# Patient Record
Sex: Male | Born: 1950 | Race: Black or African American | Hispanic: No | Marital: Single
Health system: Southern US, Community
[De-identification: ages and names within clinical notes are randomized; demographics above are authoritative.]

---

## 2007-08-16 ENCOUNTER — Emergency Department (HOSPITAL_COMMUNITY): Admission: EM | Admit: 2007-08-16 | Discharge: 2007-08-16 | Payer: Self-pay | Admitting: Emergency Medicine

## 2008-10-17 IMAGING — CR DG KNEE COMPLETE 4+V*R*
4 series · 4 of 4 positions shown · non-contrast
Comparison: none

CLINICAL DATA: Chronic knee pain, no injury

RIGHT KNEE - 4 VIEW

[view not recorded (1 of 4)]
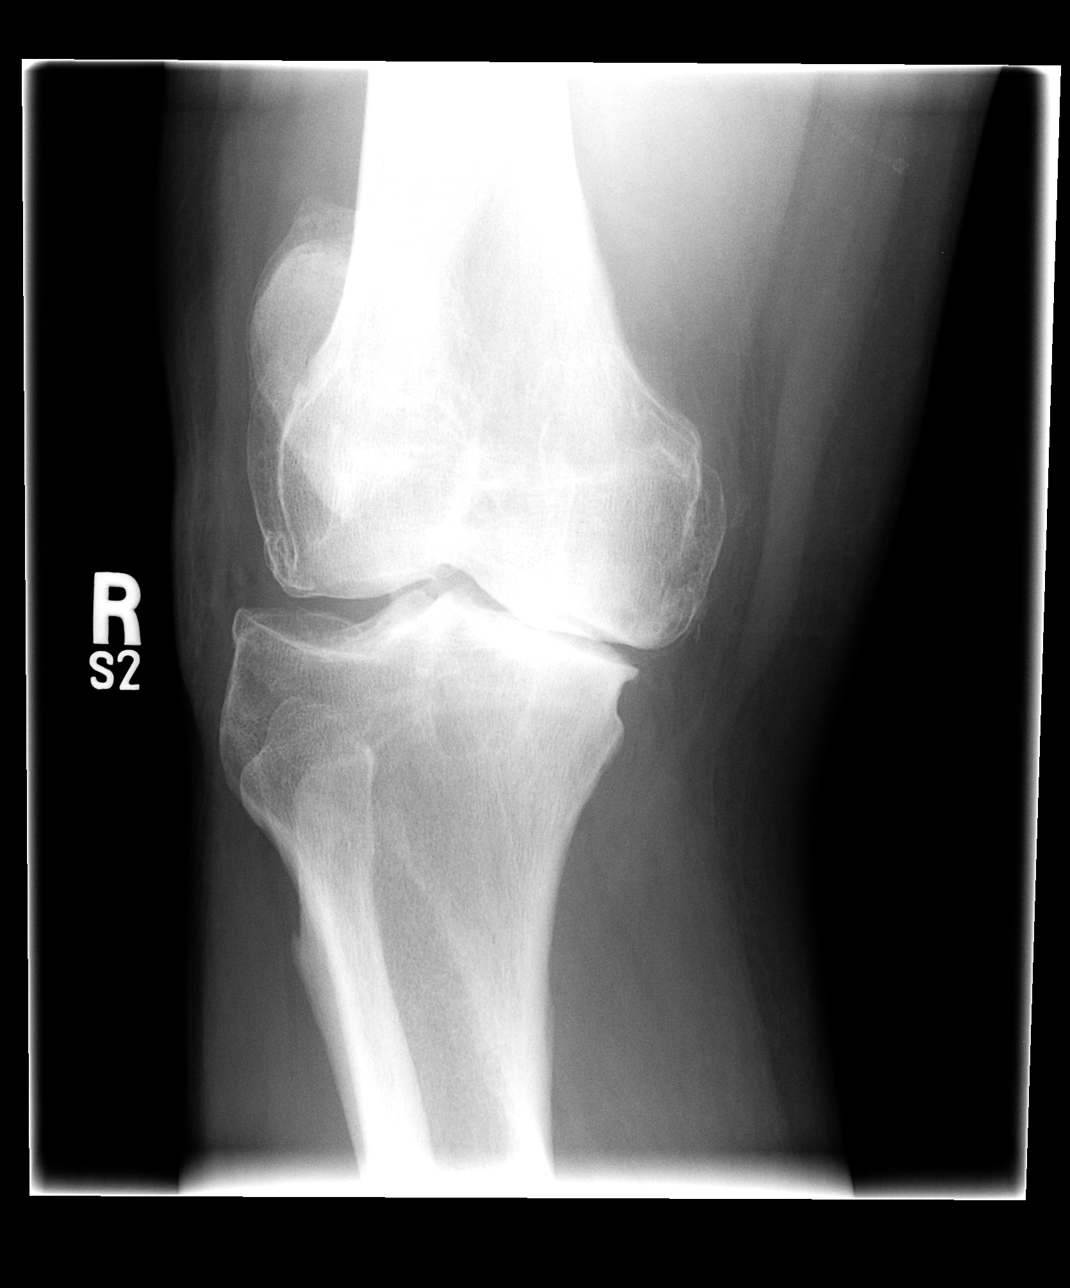

[view not recorded (2 of 4)]
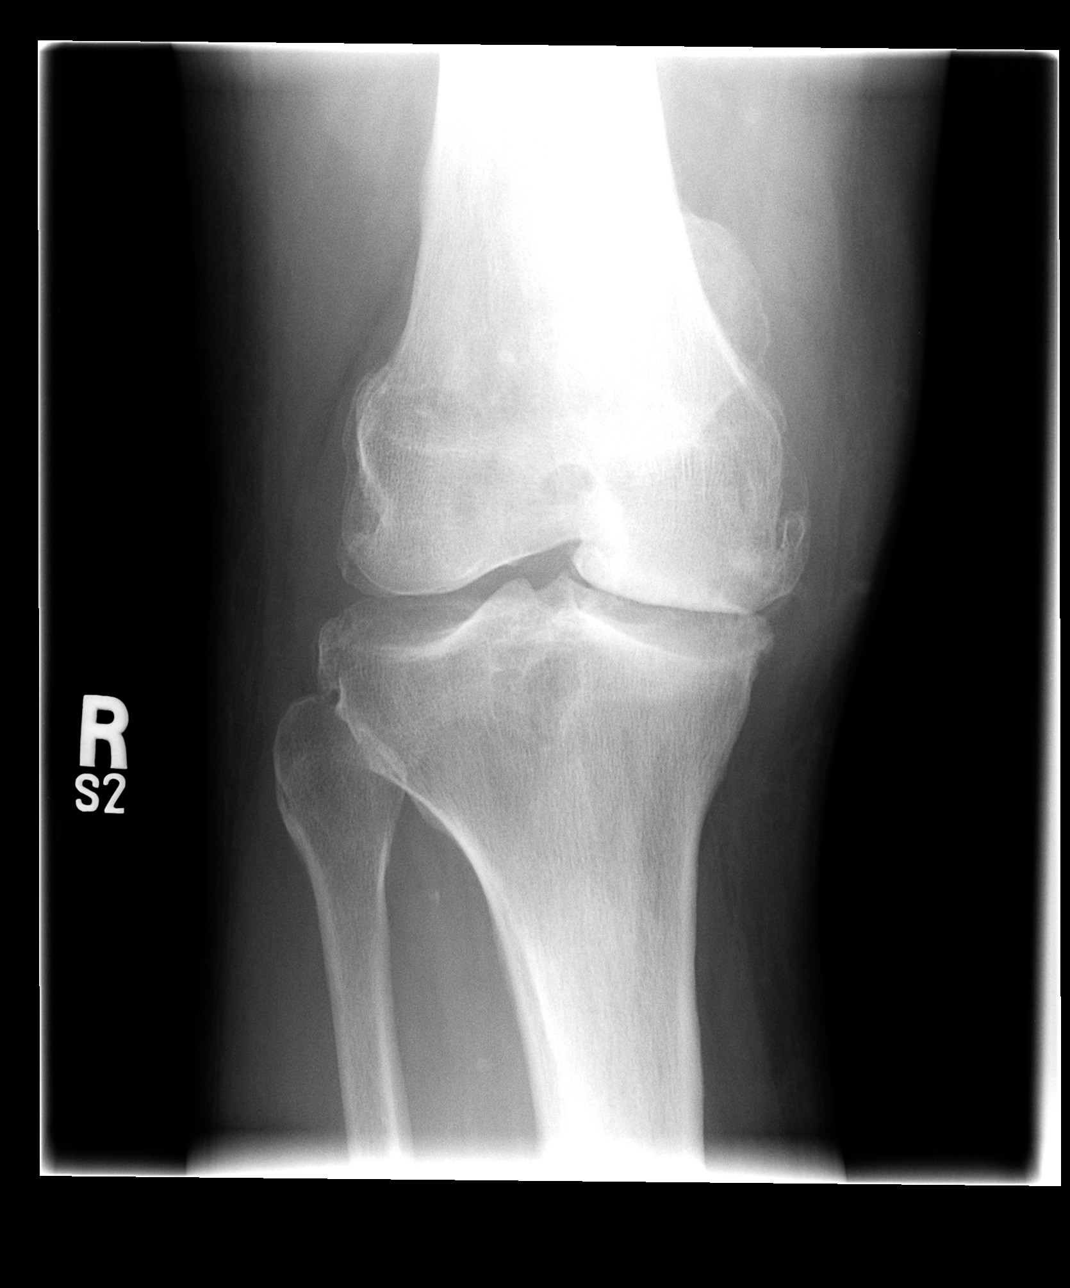

[view not recorded (3 of 4)]
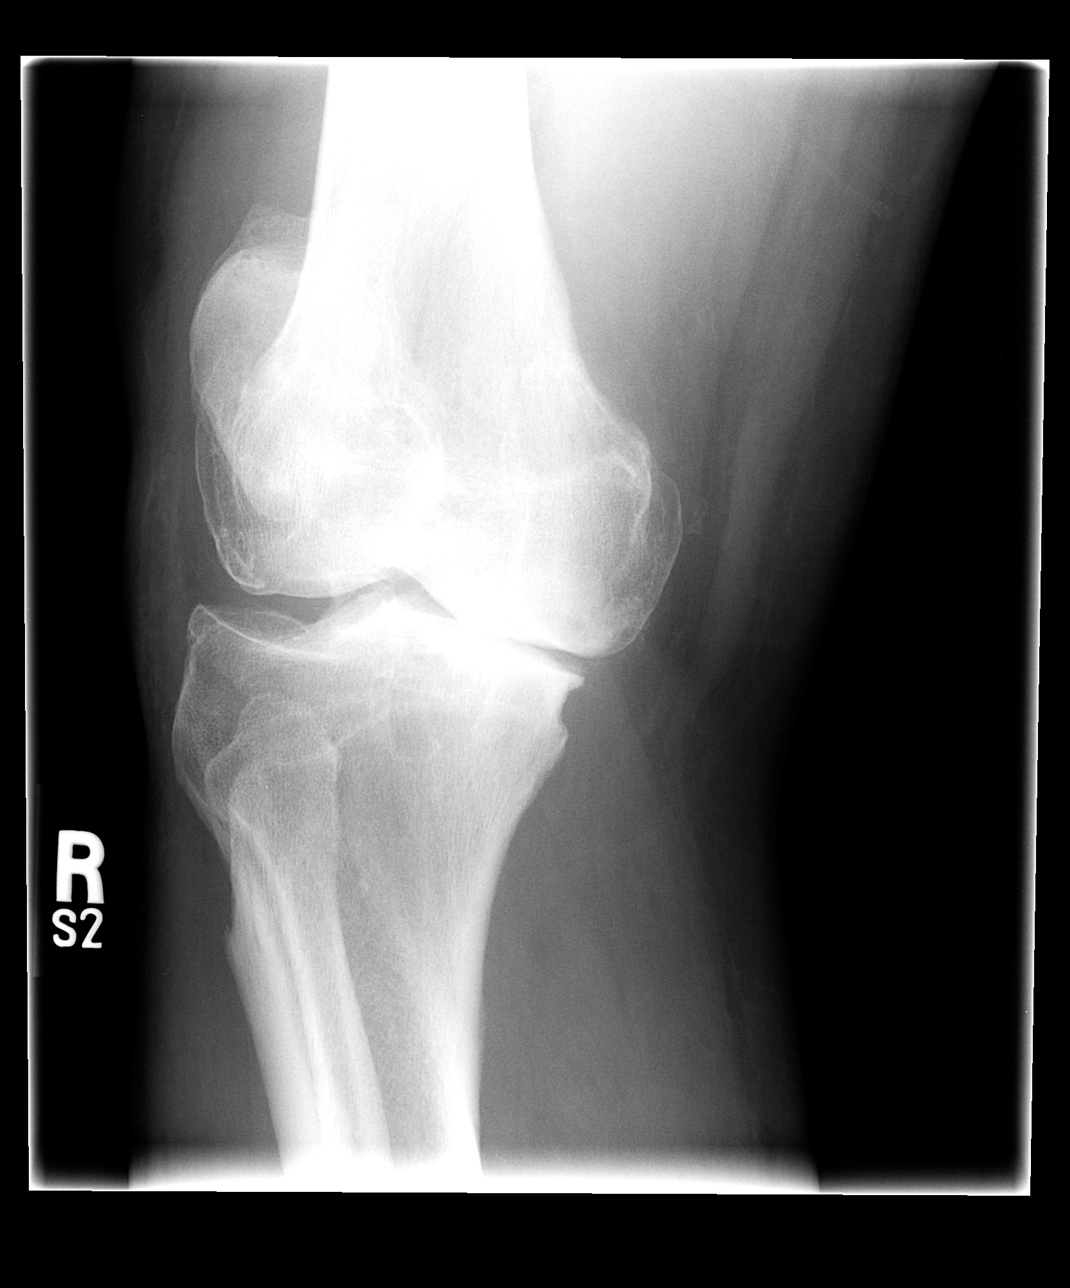

[view not recorded (4 of 4)]
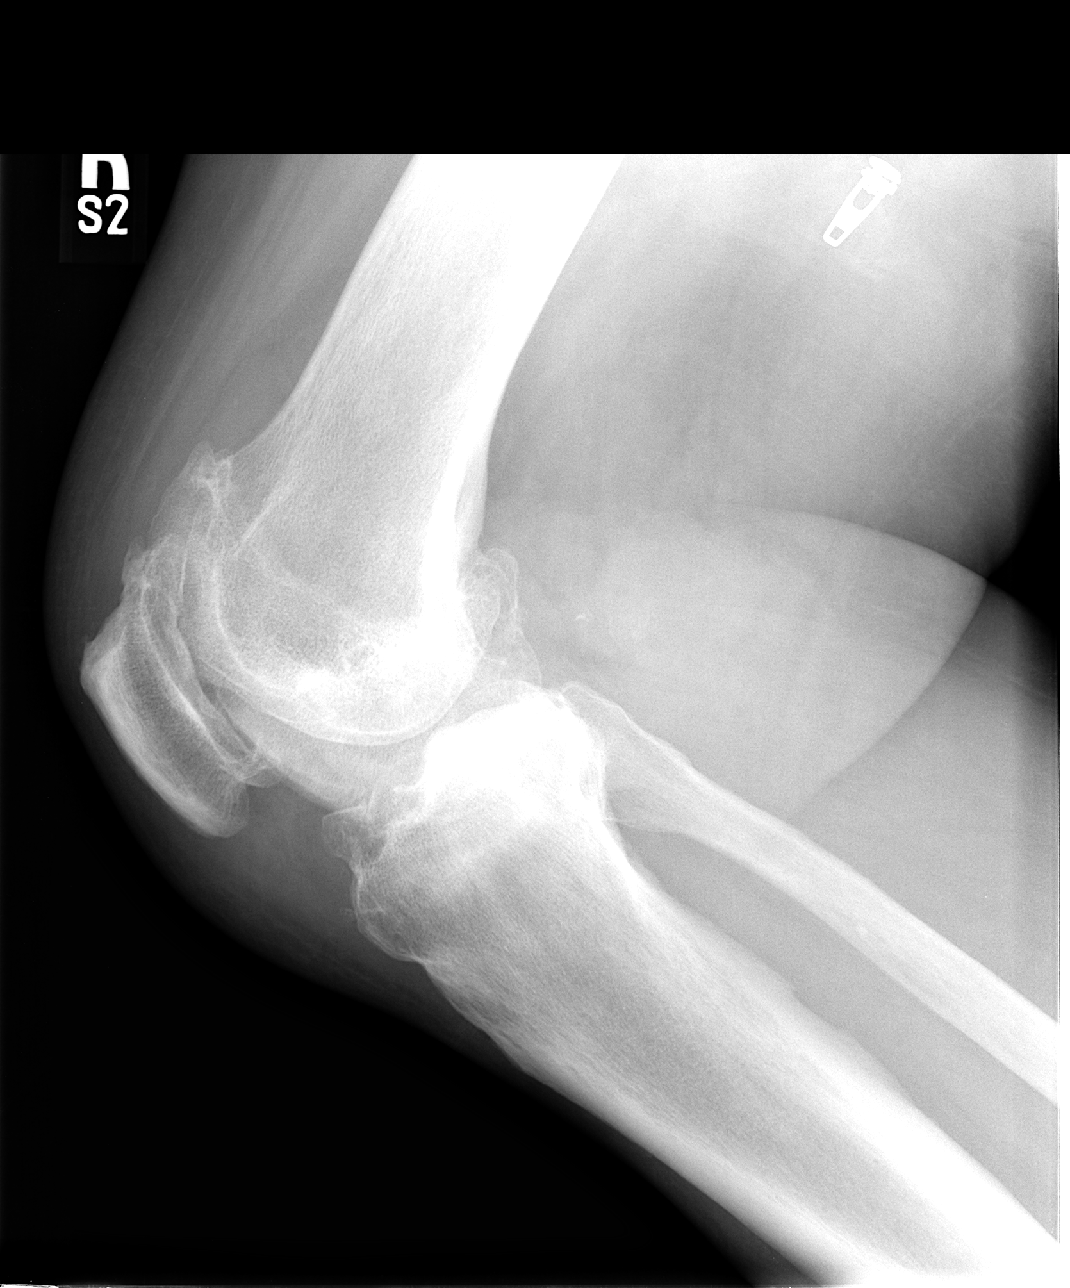

[4 of 4 positions shown; findings below may reference images not displayed]

FINDINGS: There are severe degenerative changes within the right knee, most
pronounced in the medial compartment and patellofemoral compartments with severe
joint space narrowing and marked osteophyte formation. No acute bony
abnormality. No joint effusion.

IMPRESSION

Severe degenerative changes.

## 2010-09-29 ENCOUNTER — Encounter
Admission: RE | Admit: 2010-09-29 | Discharge: 2010-11-12 | Payer: Self-pay | Source: Home / Self Care | Attending: Internal Medicine | Admitting: Internal Medicine

## 2010-12-17 ENCOUNTER — Encounter: Admit: 2010-12-17 | Payer: Self-pay | Admitting: Internal Medicine

## 2010-12-18 ENCOUNTER — Encounter: Admission: RE | Admit: 2010-12-18 | Payer: Self-pay | Source: Home / Self Care | Admitting: Internal Medicine

## 2010-12-18 DIAGNOSIS — M6281 Muscle weakness (generalized): Secondary | ICD-10-CM | POA: Insufficient documentation

## 2010-12-18 DIAGNOSIS — IMO0001 Reserved for inherently not codable concepts without codable children: Secondary | ICD-10-CM | POA: Insufficient documentation

## 2010-12-18 DIAGNOSIS — R5381 Other malaise: Secondary | ICD-10-CM | POA: Insufficient documentation

## 2010-12-18 DIAGNOSIS — M25519 Pain in unspecified shoulder: Secondary | ICD-10-CM | POA: Insufficient documentation

## 2010-12-18 DIAGNOSIS — M25619 Stiffness of unspecified shoulder, not elsewhere classified: Secondary | ICD-10-CM | POA: Insufficient documentation

## 2010-12-22 ENCOUNTER — Encounter
Admit: 2010-12-22 | Discharge: 2010-12-23 | Payer: Self-pay | Attending: Internal Medicine | Admitting: Internal Medicine

## 2010-12-25 ENCOUNTER — Ambulatory Visit: Payer: Non-veteran care | Attending: Internal Medicine

## 2022-11-09 ENCOUNTER — Other Ambulatory Visit (HOSPITAL_COMMUNITY): Payer: Self-pay | Admitting: Orthopedic Surgery

## 2022-11-09 DIAGNOSIS — M25552 Pain in left hip: Secondary | ICD-10-CM

## 2022-11-26 ENCOUNTER — Ambulatory Visit (HOSPITAL_COMMUNITY)
Admission: RE | Admit: 2022-11-26 | Discharge: 2022-11-26 | Disposition: A | Discharge status: Home / Self Care | Payer: No Typology Code available for payment source | Source: Ambulatory Visit | Attending: Orthopedic Surgery | Admitting: Orthopedic Surgery

## 2022-11-26 DIAGNOSIS — M25552 Pain in left hip: Secondary | ICD-10-CM | POA: Diagnosis not present

## 2022-11-26 MED ORDER — METHYLPREDNISOLONE ACETATE 80 MG/ML IJ SUSP
INTRAMUSCULAR | Status: AC
  Filled 2022-11-26: qty 1

## 2022-11-26 MED ORDER — BUPIVACAINE HCL (PF) 0.25 % IJ SOLN
INTRAMUSCULAR | Status: AC
  Filled 2022-11-26: qty 30

## 2022-11-26 MED ORDER — IOHEXOL 180 MG/ML  SOLN
20.0000 mL | Freq: Once | INTRAMUSCULAR | Status: AC | PRN
  Administered 2022-11-26: 5 mL via INTRATHECAL

## 2022-11-26 MED ORDER — BUPIVACAINE HCL (PF) 0.25 % IJ SOLN
10.0000 mL | Freq: Once | INTRAMUSCULAR | Status: AC
  Administered 2022-11-26: 5 mL

## 2022-11-26 MED ORDER — METHYLPREDNISOLONE ACETATE 40 MG/ML INJ SUSP (RADIOLOG
120.0000 mg | Freq: Once | INTRAMUSCULAR | Status: AC
  Administered 2022-11-26: 80 mg via INTRA_ARTICULAR

## 2022-11-26 MED ORDER — LIDOCAINE HCL (PF) 1 % IJ SOLN
5.0000 mL | Freq: Once | INTRAMUSCULAR | Status: AC
  Administered 2022-11-26: 3 mL via INTRADERMAL

## 2022-11-26 NOTE — Procedures (Signed)
Radiology Procedure Note  Risks and benefits of joint injection were discussed with the patient including, but not limited to bleeding, infection, damage to adjacent structures, allergic reaction, and extraarticular contrast injection.    All of the patient's questions were answered, patient is agreeable to proceed. Consent signed and in chart.  A timeout was performed with all members of the team prior to start of the procedure. Correct patient and correct procedure was confirmed. Allergies were reviewed.   PROCEDURE SUMMARY:  Successful fluoro guided left hip steroid injection. No immediate complications.  Patient tolerated well.   EBL = trace  Please see full dictation in imaging section of Epic for procedure details.   Armando Gang Analynn Daum PA-C 11/26/2022 10:27 AM
